# Patient Record
Sex: Male | Born: 1990 | Race: Black or African American | Hispanic: No | Marital: Single | Smoking: Current every day smoker
Health system: Southern US, Community
[De-identification: ages and names within clinical notes are randomized; demographics above are authoritative.]

---

## 2014-05-13 ENCOUNTER — Emergency Department (HOSPITAL_COMMUNITY)
Admission: EM | Admit: 2014-05-13 | Discharge: 2014-05-14 | Disposition: A | Discharge status: Home / Self Care | Payer: Self-pay | Attending: Emergency Medicine | Admitting: Emergency Medicine

## 2014-05-13 ENCOUNTER — Encounter (HOSPITAL_COMMUNITY): Payer: Self-pay | Admitting: Emergency Medicine

## 2014-05-13 ENCOUNTER — Emergency Department (HOSPITAL_COMMUNITY): Payer: Self-pay

## 2014-05-13 DIAGNOSIS — M7989 Other specified soft tissue disorders: Secondary | ICD-10-CM

## 2014-05-13 DIAGNOSIS — Z720 Tobacco use: Secondary | ICD-10-CM | POA: Insufficient documentation

## 2014-05-13 DIAGNOSIS — R000 Tachycardia, unspecified: Secondary | ICD-10-CM | POA: Insufficient documentation

## 2014-05-13 DIAGNOSIS — Z79899 Other long term (current) drug therapy: Secondary | ICD-10-CM | POA: Insufficient documentation

## 2014-05-13 DIAGNOSIS — Z87828 Personal history of other (healed) physical injury and trauma: Secondary | ICD-10-CM | POA: Insufficient documentation

## 2014-05-13 DIAGNOSIS — R2242 Localized swelling, mass and lump, left lower limb: Secondary | ICD-10-CM | POA: Insufficient documentation
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
## 2014-05-14 LAB — CBC WITH DIFFERENTIAL/PLATELET
BASOS ABS: 0 10*3/uL (ref 0.0–0.1)
Basophils Relative: 0 % (ref 0–1)
EOS ABS: 0.1 10*3/uL (ref 0.0–0.7)
Eosinophils Relative: 1 % (ref 0–5)
HCT: 37 % — ABNORMAL LOW (ref 39.0–52.0)
Hemoglobin: 11.7 g/dL — ABNORMAL LOW (ref 13.0–17.0)
LYMPHS ABS: 1.7 10*3/uL (ref 0.7–4.0)
Lymphocytes Relative: 18 % (ref 12–46)
MCH: 26.6 pg (ref 26.0–34.0)
MCHC: 31.6 g/dL (ref 30.0–36.0)
MCV: 84.1 fL (ref 78.0–100.0)
MONO ABS: 0.7 10*3/uL (ref 0.1–1.0)
MONOS PCT: 7 % (ref 3–12)
Neutro Abs: 7 10*3/uL (ref 1.7–7.7)
Neutrophils Relative %: 74 % (ref 43–77)
PLATELETS: 379 10*3/uL (ref 150–400)
RBC: 4.4 MIL/uL (ref 4.22–5.81)
RDW: 15.1 % (ref 11.5–15.5)
WBC: 9.5 10*3/uL (ref 4.0–10.5)

## 2014-05-14 LAB — BASIC METABOLIC PANEL
Anion gap: 7 (ref 5–15)
BUN: 8 mg/dL (ref 6–23)
CO2: 26 mmol/L (ref 19–32)
CREATININE: 0.78 mg/dL (ref 0.50–1.35)
Calcium: 9.1 mg/dL (ref 8.4–10.5)
Chloride: 107 mmol/L (ref 96–112)
Glucose, Bld: 84 mg/dL (ref 70–99)
Potassium: 3.6 mmol/L (ref 3.5–5.1)
SODIUM: 140 mmol/L (ref 135–145)

## 2014-05-14 LAB — C-REACTIVE PROTEIN

## 2014-05-14 LAB — SEDIMENTATION RATE: Sed Rate: 25 mm/hr — ABNORMAL HIGH (ref 0–16)

## 2014-05-14 NOTE — ED Notes (Signed)
Pt ambulating independently w/ steady gait on d/c in no acute distress, A&Ox4.  
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
## 2014-05-14 NOTE — Discharge Instructions (Addendum)
You may bear weight on your left leg to a maximum of 50 lbs.  Use the cam walker boot to help hold your foot in a neutral position.  Keep your toes elevated above your nose as much as possible to help with the swelling.  You may use an ace bandage to help with the swelling as well.  Follow up with Dr. Christena DeemBosley as soon as possible for your wrist.  Work on stretching on your fingers until you get back to [place] to see Dr. Christena DeemBosley.
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
